# Patient Record
Sex: Male | Born: 1965 | Race: White | Hispanic: No | Marital: Married | Smoking: Current every day smoker
Health system: Southern US, Community
[De-identification: ages and names within clinical notes are randomized; demographics above are authoritative.]

## PROBLEM LIST (undated history)

## (undated) DIAGNOSIS — G8929 Other chronic pain: Secondary | ICD-10-CM

## (undated) DIAGNOSIS — N289 Disorder of kidney and ureter, unspecified: Secondary | ICD-10-CM

## (undated) DIAGNOSIS — Z8679 Personal history of other diseases of the circulatory system: Secondary | ICD-10-CM

## (undated) DIAGNOSIS — E669 Obesity, unspecified: Secondary | ICD-10-CM

## (undated) DIAGNOSIS — Z87442 Personal history of urinary calculi: Secondary | ICD-10-CM

## (undated) HISTORY — DX: Personal history of other diseases of the circulatory system: Z86.79

## (undated) HISTORY — DX: Personal history of urinary calculi: Z87.442

## (undated) HISTORY — DX: Obesity, unspecified: E66.9

## (undated) HISTORY — DX: Other chronic pain: G89.29

---
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
## 1997-11-27 ENCOUNTER — Encounter: Payer: Self-pay | Admitting: Emergency Medicine

## 1997-11-27 ENCOUNTER — Ambulatory Visit (HOSPITAL_COMMUNITY): Admission: RE | Admit: 1997-11-27 | Discharge: 1997-11-27 | Payer: Self-pay | Admitting: Neurosurgery

## 1997-11-27 ENCOUNTER — Emergency Department (HOSPITAL_COMMUNITY): Admission: EM | Admit: 1997-11-27 | Discharge: 1997-11-27 | Payer: Self-pay | Admitting: Emergency Medicine

## 1997-12-11 ENCOUNTER — Ambulatory Visit (HOSPITAL_COMMUNITY): Admission: RE | Admit: 1997-12-11 | Discharge: 1997-12-11 | Payer: Self-pay | Admitting: Neurosurgery

## 1997-12-25 ENCOUNTER — Ambulatory Visit (HOSPITAL_COMMUNITY): Admission: RE | Admit: 1997-12-25 | Discharge: 1997-12-25 | Payer: Self-pay | Admitting: Neurosurgery

## 1998-02-01 ENCOUNTER — Encounter: Payer: Self-pay | Admitting: Internal Medicine

## 1998-02-01 ENCOUNTER — Ambulatory Visit (HOSPITAL_COMMUNITY): Admission: RE | Admit: 1998-02-01 | Discharge: 1998-02-01 | Payer: Self-pay | Admitting: Internal Medicine

## 1998-03-14 ENCOUNTER — Inpatient Hospital Stay (HOSPITAL_COMMUNITY): Admission: RE | Admit: 1998-03-14 | Discharge: 1998-03-17 | Payer: Self-pay | Admitting: Neurosurgery

## 1998-12-16 ENCOUNTER — Encounter: Admission: RE | Admit: 1998-12-16 | Discharge: 1998-12-16 | Payer: Self-pay | Admitting: Neurosurgery

## 1999-03-18 ENCOUNTER — Encounter: Admission: RE | Admit: 1999-03-18 | Discharge: 1999-03-18 | Payer: Self-pay | Admitting: Neurosurgery

## 1999-08-29 ENCOUNTER — Ambulatory Visit (HOSPITAL_COMMUNITY): Admission: RE | Admit: 1999-08-29 | Discharge: 1999-08-29 | Payer: Self-pay | Admitting: Neurosurgery

## 2000-03-23 ENCOUNTER — Encounter: Admission: RE | Admit: 2000-03-23 | Discharge: 2000-03-23 | Payer: Self-pay | Admitting: Neurosurgery

## 2000-06-15 ENCOUNTER — Encounter: Admission: RE | Admit: 2000-06-15 | Discharge: 2000-06-15 | Payer: Self-pay | Admitting: Neurosurgery

## 2000-08-15 ENCOUNTER — Emergency Department (HOSPITAL_COMMUNITY): Admission: EM | Admit: 2000-08-15 | Discharge: 2000-08-15 | Payer: Self-pay | Admitting: Emergency Medicine

## 2000-12-17 ENCOUNTER — Encounter: Admission: RE | Admit: 2000-12-17 | Discharge: 2000-12-17 | Payer: Self-pay | Admitting: Neurosurgery

## 2010-11-05 ENCOUNTER — Other Ambulatory Visit: Payer: Self-pay | Admitting: Neurosurgery

## 2010-11-05 DIAGNOSIS — M549 Dorsalgia, unspecified: Secondary | ICD-10-CM

## 2010-11-17 ENCOUNTER — Ambulatory Visit
Admission: RE | Admit: 2010-11-17 | Discharge: 2010-11-17 | Disposition: A | Discharge status: Home / Self Care | Payer: No Typology Code available for payment source | Source: Ambulatory Visit | Attending: Neurosurgery | Admitting: Neurosurgery

## 2010-11-17 DIAGNOSIS — M549 Dorsalgia, unspecified: Secondary | ICD-10-CM

## 2016-07-29 ENCOUNTER — Other Ambulatory Visit: Payer: Self-pay | Admitting: Neurosurgery

## 2016-07-29 DIAGNOSIS — M545 Low back pain: Principal | ICD-10-CM

## 2016-07-29 DIAGNOSIS — G8929 Other chronic pain: Secondary | ICD-10-CM

## 2016-08-21 ENCOUNTER — Inpatient Hospital Stay
Admission: RE | Admit: 2016-08-21 | Discharge: 2016-08-21 | Disposition: A | Discharge status: Home / Self Care | Payer: Managed Care, Other (non HMO) | Source: Ambulatory Visit | Attending: Neurosurgery | Admitting: Neurosurgery

## 2016-08-21 ENCOUNTER — Other Ambulatory Visit: Payer: Self-pay

## 2016-09-04 ENCOUNTER — Other Ambulatory Visit: Payer: Self-pay

## 2016-09-04 ENCOUNTER — Inpatient Hospital Stay
Admission: RE | Admit: 2016-09-04 | Discharge: 2016-09-04 | Disposition: A | Discharge status: Home / Self Care | Payer: Managed Care, Other (non HMO) | Source: Ambulatory Visit | Attending: Neurosurgery | Admitting: Neurosurgery

## 2016-09-04 NOTE — Discharge Instructions (Signed)

## 2017-04-13 ENCOUNTER — Other Ambulatory Visit: Payer: Self-pay | Admitting: Neurosurgery

## 2017-04-13 DIAGNOSIS — M48061 Spinal stenosis, lumbar region without neurogenic claudication: Secondary | ICD-10-CM

## 2017-05-28 ENCOUNTER — Ambulatory Visit
Admission: RE | Admit: 2017-05-28 | Discharge: 2017-05-28 | Disposition: A | Discharge status: Home / Self Care | Payer: 59 | Source: Ambulatory Visit | Attending: Neurosurgery | Admitting: Neurosurgery

## 2017-05-28 DIAGNOSIS — M48061 Spinal stenosis, lumbar region without neurogenic claudication: Secondary | ICD-10-CM

## 2017-05-28 MED ORDER — DIAZEPAM 5 MG PO TABS
10.0000 mg | ORAL_TABLET | Freq: Once | ORAL | Status: AC
Start: 2017-05-28 — End: 2017-05-28
  Administered 2017-05-28: 10 mg via ORAL

## 2017-05-28 MED ORDER — IOPAMIDOL (ISOVUE-M 200) INJECTION 41%
15.0000 mL | Freq: Once | INTRAMUSCULAR | Status: AC
  Administered 2017-05-28: 15 mL via INTRATHECAL

## 2017-05-28 MED ORDER — MEPERIDINE HCL 100 MG/ML IJ SOLN
75.0000 mg | Freq: Once | INTRAMUSCULAR | Status: AC
  Administered 2017-05-28: 75 mg via INTRAMUSCULAR

## 2017-05-28 MED ORDER — ONDANSETRON HCL 4 MG/2ML IJ SOLN
4.0000 mg | Freq: Once | INTRAMUSCULAR | Status: AC
  Administered 2017-05-28: 4 mg via INTRAMUSCULAR

## 2017-05-28 NOTE — Discharge Instructions (Signed)

## 2018-11-29 ENCOUNTER — Ambulatory Visit
Admission: EM | Admit: 2018-11-29 | Discharge: 2018-11-29 | Disposition: A | Discharge status: Home / Self Care | Payer: Managed Care, Other (non HMO) | Attending: Physician Assistant | Admitting: Physician Assistant

## 2018-11-29 ENCOUNTER — Other Ambulatory Visit: Payer: Self-pay

## 2018-11-29 ENCOUNTER — Encounter: Payer: Self-pay | Admitting: Emergency Medicine

## 2018-11-29 DIAGNOSIS — R109 Unspecified abdominal pain: Secondary | ICD-10-CM | POA: Diagnosis not present

## 2018-11-29 DIAGNOSIS — R300 Dysuria: Secondary | ICD-10-CM

## 2018-11-29 LAB — POCT URINALYSIS DIP (MANUAL ENTRY)
Bilirubin, UA: NEGATIVE
Glucose, UA: NEGATIVE mg/dL
Ketones, POC UA: NEGATIVE mg/dL
Leukocytes, UA: NEGATIVE
Nitrite, UA: NEGATIVE
Protein Ur, POC: NEGATIVE mg/dL
Spec Grav, UA: >=1.03 — AB (ref 1.010–1.025)
Urobilinogen, UA: 0.2 E.U./dL
pH, UA: 6 (ref 5.0–8.0)

## 2018-11-29 MED ORDER — TIZANIDINE HCL 4 MG PO TABS: 4.0000 mg | ORAL_TABLET | Freq: Three times a day (TID) | ORAL | 0 refills | Status: DC | PRN

## 2018-11-29 MED ORDER — TAMSULOSIN HCL 0.4 MG PO CAPS: 0.4000 mg | ORAL_CAPSULE | Freq: Every day | ORAL | 0 refills | Status: DC

## 2018-11-29 MED ORDER — KETOROLAC TROMETHAMINE 30 MG/ML IJ SOLN
30.0000 mg | Freq: Once | INTRAMUSCULAR | Status: AC
  Administered 2018-11-29: 18:00:00 30 mg via INTRAMUSCULAR

## 2018-11-29 NOTE — ED Triage Notes (Signed)
Pt presents to [facility] for assessment of discomfort while urinating x 5 days, right flank pain and RLQ abdominal pain.  Pt states hx of multiple back surgeries, and takes norco which he felt was masking the pain at the time, began to be more severe the last three days.

## 2018-11-29 NOTE — Discharge Instructions (Signed)
As discussed, urine with trace blood.  No infection.  Symptoms possibly due to kidney stone, or muscle pain.  Start Flomax as directed, and stay hydrated.  You can use strainer to see if you pass a stone.  If pain clinic allows, you can take tizanidine for possible muscle pain.  If symptoms does not improve in 1 week,  follow-up with urologist for further evaluation and management needed.  If worsening symptoms, nausea, vomiting, fever, unable to urinate, go to the emergency department for further evaluation.

## 2018-11-29 NOTE — ED Notes (Signed)
Patient able to ambulate independently  

## 2018-11-29 NOTE — ED Provider Notes (Signed)
[facility]    CSN: [id number] Arrival date & time: 11/29/18  1731      History   Chief Complaint Chief Complaint  Patient presents with  . Dysuria  . Flank Pain  . Abdominal Pain    HPI Hayden Thomas is a 53 y.o. male.   53 year old male comes in for 5-day history of dysuria, right flank pain, right lower quadrant pain.  Patient states he takes chronic narcotics due to multiple back surgeries, and this may have masked his pain/changed symptoms.  However, the past 3 days, he has noticed more pain to the lower back/flank area.  He denies radiation of pain, saddle anesthesia, loss of bladder or bowel control.  He denies hematuria, urinary frequency.  Denies fever, chills, body aches.  Denies urinary hesitancy, history of prostate issues.  Denies painful bowel movements.  Denies testicular swelling, testicular pain, penile discharge, penile lesion.  Patient with history of cholecystectomy, appendectomy.  History of lumbar spinal fusion.     Past Medical History:  Diagnosis Date  . Renal disorder    kidney stone    There are no active problems to display for this patient.   Past Surgical History:  Procedure Laterality Date  . BACK SURGERY    . CHOLECYSTECTOMY         Home Medications    Prior to Admission medications   Medication Sig Start Date End Date Taking? Authorizing Provider  aspirin EC 81 MG tablet Take 81 mg by mouth.    [provider]  clotrimazole-betamethasone (LOTRISONE) cream APPLY SPARINGLY TO THE AFFECTED AREA(S) TWICE DAILY. 11/21/15   [provider]  HYDROcodone-acetaminophen (NORCO) 10-325 MG tablet TAKE 1 TABLET BY MOUTH EVERY 8 HOURS AS NEEDED FOR PAIN 02/25/15   [provider]  tamsulosin (FLOMAX) 0.4 MG CAPS capsule Take 1 capsule (0.4 mg total) by mouth daily. 11/29/18   Tasia Catchings, Jarely Juncaj V, PA-C  tiZANidine (ZANAFLEX) 4 MG tablet Take 1 tablet (4 mg total) by mouth every 8 (eight) hours as needed for muscle  spasms. 11/29/18   Ok Edwards, PA-C    Family History Family History  Problem Relation Age of Onset  . Multiple sclerosis Mother     Social History Social History   Tobacco Use  . Smoking status: Current Every Day Smoker    Packs/day: 1.00  . Smokeless tobacco: Never Used  Substance Use Topics  . Alcohol use: Never    Frequency: Never  . Drug use: Never     Allergies   Patient has no known allergies.   Review of Systems Review of Systems  Reason unable to perform ROS: See HPI as above.     Physical Exam Triage Vital Signs ED Triage Vitals [11/29/18 1738]  Enc Vitals Group     BP (!) 164/91     Pulse Rate 85     Resp 18     Temp 97.8 F (36.6 C)     Temp Source Temporal     SpO2 94 %     Weight      Height      Head Circumference      Peak Flow      Pain Score 2     Pain Loc      Pain Edu?      Excl. in [place]?    No data found.  Updated Vital Signs BP (!) 164/91 (BP Location: Right Arm)   Pulse 85   Temp 97.8 F (  36.6 C) (Temporal)   Resp 18   SpO2 94%   Physical Exam Constitutional:      General: He is not in acute distress.    Appearance: He is well-developed. He is not diaphoretic.  HENT:     Head: Normocephalic and atraumatic.  Eyes:     Conjunctiva/sclera: Conjunctivae normal.     Pupils: Pupils are equal, round, and reactive to light.  Cardiovascular:     Rate and Rhythm: Normal rate and regular rhythm.     Heart sounds: Normal heart sounds. No murmur. No friction rub. No gallop.   Pulmonary:     Effort: Pulmonary effort is normal. No accessory muscle usage or respiratory distress.     Breath sounds: Normal breath sounds. No stridor. No decreased breath sounds, wheezing, rhonchi or rales.  Abdominal:     General: Bowel sounds are normal.     Palpations: Abdomen is soft.     Tenderness: There is no abdominal tenderness. There is right CVA tenderness. There is no left CVA tenderness, guarding or rebound. Negative signs include Rovsing's  sign, McBurney's sign, psoas sign and obturator sign.     Hernia: No hernia is present.  Musculoskeletal:     Comments: Vertical scar to the lumbar spine.  Patient would like to avoid palpation to the area due to prior history, and baseline pain.  Current pain is to the right flank/lumbar region.  Tenderness to palpation.  Decreased rotation of back which is at baseline.  Full range of motion of hips.  Strength deferred.  Sensation intact and equal bilaterally.  Negative straight leg raise.  Skin:    General: Skin is warm and dry.  Neurological:     Mental Status: He is alert and oriented to person, place, and time.      UC Treatments / Results  Labs (all labs ordered are listed, but only abnormal results are displayed) Labs Reviewed  POCT URINALYSIS DIP (MANUAL ENTRY) - Abnormal; Notable for the following components:      Result Value   Spec Grav, UA >=1.030 (*)    Blood, UA trace-lysed (*)    All other components within normal limits    EKG   Radiology No results found.  Procedures Procedures (including critical care time)  Medications Ordered in UC Medications  ketorolac (TORADOL) 30 MG/ML injection 30 mg (30 mg Intramuscular Given 11/29/18 1816)    Initial Impression / Assessment and Plan / UC Course  I have reviewed the triage vital signs and the nursing notes.  Pertinent labs & imaging results that were available during my care of the patient were reviewed by me and considered in my medical decision making (see chart for details).    Urine was trace blood.  Given dysuria, CVA tenderness, discussed possible kidney stones causing symptoms.  Lower suspicion for prostatitis at this time given negative leukocytes, nitrites, history of prostate symptoms.  Patient without tenderness to palpation of right lower quadrant, status post appendectomy.  No acute abdomen at this time.  Will provide Toradol injection in office.  Will have patient start Flomax for possible kidney  stone.  Discussed use of tizanidine for possible muscular pain, patient states will check with pain clinic before taking medicine.  Return precautions given.  Patient expresses understanding and agrees to plan.  Final Clinical Impressions(s) / UC Diagnoses   Final diagnoses:  Right flank pain  Dysuria   ED Prescriptions    Medication Sig Dispense Auth. Provider   tamsulosin [facility](FLOMAX)  0.4 MG CAPS capsule Take 1 capsule (0.4 mg total) by mouth daily. 10 capsule Cathie Hoops, Lorna Strother V, PA-C   tiZANidine (ZANAFLEX) 4 MG tablet Take 1 tablet (4 mg total) by mouth every 8 (eight) hours as needed for muscle spasms. 15 tablet Belinda Fisher, PA-C     I have reviewed the PDMP during this encounter.   Belinda Fisher, PA-C 11/29/18 [phone]

## 2019-01-27 ENCOUNTER — Other Ambulatory Visit: Payer: Self-pay

## 2019-01-27 ENCOUNTER — Ambulatory Visit
Admission: EM | Admit: 2019-01-27 | Discharge: 2019-01-27 | Disposition: A | Discharge status: Home / Self Care | Payer: Managed Care, Other (non HMO) | Attending: Physician Assistant | Admitting: Physician Assistant

## 2019-01-27 ENCOUNTER — Encounter: Payer: Self-pay | Admitting: Emergency Medicine

## 2019-01-27 DIAGNOSIS — R300 Dysuria: Secondary | ICD-10-CM | POA: Diagnosis not present

## 2019-01-27 DIAGNOSIS — M545 Low back pain, unspecified: Secondary | ICD-10-CM

## 2019-01-27 LAB — POCT URINALYSIS DIP (MANUAL ENTRY)
Glucose, UA: NEGATIVE mg/dL
Ketones, POC UA: NEGATIVE mg/dL
Leukocytes, UA: NEGATIVE
Nitrite, UA: NEGATIVE
Spec Grav, UA: >=1.03 — AB (ref 1.010–1.025)
Urobilinogen, UA: 0.2 E.U./dL
pH, UA: 5.5 (ref 5.0–8.0)

## 2019-01-27 MED ORDER — KETOROLAC TROMETHAMINE 30 MG/ML IJ SOLN
30.0000 mg | Freq: Once | INTRAMUSCULAR | Status: AC
  Administered 2019-01-27: 19:00:00 30 mg via INTRAMUSCULAR

## 2019-01-27 MED ORDER — SULFAMETHOXAZOLE-TRIMETHOPRIM 800-160 MG PO TABS: 1.0000 | ORAL_TABLET | Freq: Two times a day (BID) | ORAL | 0 refills | Status: AC

## 2019-01-27 MED ORDER — TIZANIDINE HCL 4 MG PO TABS: 4.0000 mg | ORAL_TABLET | Freq: Four times a day (QID) | ORAL | 0 refills | Status: DC | PRN

## 2019-01-27 NOTE — ED Provider Notes (Signed)
[facility]    CSN: [id number] Arrival date & time: 01/27/19  1744      History   Chief Complaint Chief Complaint  Patient presents with  . Abdominal Pain    HPI Hayden Thomas is a 53 y.o. male.   53 year old male comes in for 5-day history of right lower abdominal pain, right low back pain.  Denies injury/trauma.  Has had dysuria for the past 3 days without urinary frequency, urgency, hematuria.  Nausea without vomiting.  States nausea more associated to pain.  Denies fever, chills, body aches.  Denies penile discharge, testicular swelling, testicular pain.  Denies constipation, diarrhea.  Last BM yesterday, no straining.  Has still been passing gas.  History of appendectomy, cholecystectomy.  Patient with history of renal stone, most recently treated 11/29/2018.  Still had mild symptoms, and so urology since visit.  Was told there was "no more kidney stone".  States x-ray was done, no known CT scan/ultrasound.  No obvious history of prostate problems. Sexually active with one male partner.      Past Medical History:  Diagnosis Date  . Renal disorder    kidney stone    There are no problems to display for this patient.   Past Surgical History:  Procedure Laterality Date  . BACK SURGERY    . CHOLECYSTECTOMY         Home Medications    Prior to Admission medications   Medication Sig Start Date End Date Taking? Authorizing Provider  aspirin EC 81 MG tablet Take 81 mg by mouth.    [provider]  clotrimazole-betamethasone (LOTRISONE) cream APPLY SPARINGLY TO THE AFFECTED AREA(S) TWICE DAILY. 11/21/15   [provider]  HYDROcodone-acetaminophen (NORCO) 10-325 MG tablet TAKE 1 TABLET BY MOUTH EVERY 8 HOURS AS NEEDED FOR PAIN 02/25/15   [provider]  sulfamethoxazole-trimethoprim (BACTRIM DS) 800-160 MG tablet Take 1 tablet by mouth 2 (two) times daily for 14 days. 01/27/19 02/10/19  Tasia Catchings, Amy V, PA-C  tiZANidine  (ZANAFLEX) 4 MG tablet Take 1 tablet (4 mg total) by mouth every 6 (six) hours as needed for muscle spasms. 01/27/19   Ok Edwards, PA-C    Family History Family History  Problem Relation Age of Onset  . Multiple sclerosis Mother     Social History Social History   Tobacco Use  . Smoking status: Current Every Day Smoker    Packs/day: 1.00  . Smokeless tobacco: Never Used  Substance Use Topics  . Alcohol use: Never  . Drug use: Never     Allergies   Patient has no known allergies.   Review of Systems Review of Systems  Reason unable to perform ROS: See HPI as above.     Physical Exam Triage Vital Signs ED Triage Vitals  Enc Vitals Group     BP 01/27/19 1753 (!) 148/78     Pulse Rate 01/27/19 1753 84     Resp 01/27/19 1753 20     Temp --      Temp Source 01/27/19 1753 Oral     SpO2 01/27/19 1753 96 %     Weight --      Height --      Head Circumference --      Peak Flow --      Pain Score 01/27/19 1754 2     Pain Loc --      Pain Edu? --      Excl. in [place]? --  No data found.  Updated Vital Signs BP (!) 148/78 (BP Location: Left Arm)   Pulse 84   Resp 20   SpO2 96%   Visual Acuity Right Eye Distance:   Left Eye Distance:   Bilateral Distance:    Right Eye Near:   Left Eye Near:    Bilateral Near:     Physical Exam Constitutional:      General: He is not in acute distress.    Appearance: He is well-developed. He is not ill-appearing, toxic-appearing or diaphoretic.  HENT:     Head: Normocephalic and atraumatic.  Eyes:     Conjunctiva/sclera: Conjunctivae normal.     Pupils: Pupils are equal, round, and reactive to light.  Cardiovascular:     Rate and Rhythm: Normal rate and regular rhythm.     Heart sounds: Normal heart sounds. No murmur. No friction rub. No gallop.   Pulmonary:     Effort: Pulmonary effort is normal. No accessory muscle usage or respiratory distress.     Breath sounds: Normal breath sounds. No stridor. No decreased breath  sounds, wheezing, rhonchi or rales.  Abdominal:     General: Bowel sounds are normal.     Palpations: Abdomen is soft.     Tenderness: There is no abdominal tenderness. There is no right CVA tenderness, left CVA tenderness, guarding or rebound.  Musculoskeletal:     Comments: No tenderness on palpation of the spinous processes. Tenderness to palpation of right lower back. Full range of motion. Negative straight leg raise.   Skin:    General: Skin is warm and dry.  Neurological:     Mental Status: He is alert and oriented to person, place, and time.      UC Treatments / Results  Labs (all labs ordered are listed, but only abnormal results are displayed) Labs Reviewed  POCT URINALYSIS DIP (MANUAL ENTRY) - Abnormal; Notable for the following components:      Result Value   Bilirubin, UA small (*)    Spec Grav, UA >=1.030 (*)    Blood, UA trace-intact (*)    Protein Ur, POC trace (*)    All other components within normal limits  URINE CULTURE    EKG   Radiology No results found.  Procedures Procedures (including critical care time)  Medications Ordered in UC Medications  ketorolac (TORADOL) 30 MG/ML injection 30 mg (30 mg Intramuscular Given 01/27/19 1841)    Initial Impression / Assessment and Plan / UC Course  I have reviewed the triage vital signs and the nursing notes.  Pertinent labs & imaging results that were available during my care of the patient were reviewed by me and considered in my medical decision making (see chart for details).    Discussed urine results with patient.  Still with trace blood, though was told by urology there was no longer kidney stones.  At this time, with dysuria, back pain without CVA tenderness, discussed possible prostatitis causing symptoms.  Will send for urine culture, and start Bactrim as directed.  Will provide symptomatic management with Toradol and tizanidine.  Patient to follow-up with PCP for further evaluation.  Return  precautions given.  Patient expresses understanding and agrees to plan.  Final Clinical Impressions(s) / UC Diagnoses   Final diagnoses:  Dysuria  Acute right-sided low back pain without sciatica    ED Prescriptions    Medication Sig Dispense Auth. Provider   sulfamethoxazole-trimethoprim (BACTRIM DS) 800-160 MG tablet Take 1 tablet by mouth 2 (two)  times daily for 14 days. 28 tablet Yu, Amy V, PA-C   tiZANidine (ZANAFLEX) 4 MG tablet Take 1 tablet (4 mg total) by mouth every 6 (six) hours as needed for muscle spasms. 30 tablet Belinda Fisher, PA-C     I have reviewed the PDMP during this encounter.   Belinda Fisher, PA-C 01/27/19 1927

## 2019-01-27 NOTE — Discharge Instructions (Addendum)
As discussed, urine showed blood, no obvious bacteria.  I am sending it for urine culture.  Given your history and exam, I would like to cover for prostate infection.  Start Bactrim as directed.  You can take tizanidine as needed to help with back pain.  Please follow-up with PCP for further evaluation needed.  If symptoms worsens, has fever, chills, nausea/vomiting, no longer moving bowels/passing gas, go to the emergency department for further evaluation.

## 2019-01-27 NOTE — ED Triage Notes (Addendum)
Pt presents to [facility] for assessment of RLQ abdominal pain and right lower back pain since Monday, stating last night his normal pain medication would not help and he could not sleep.  Also c/o burning with urination x 3 days ago.  Pt states pain is worse with movement and pressure to the area.  Hx of appendectomy and cholecystectomy.

## 2019-01-27 NOTE — ED Notes (Signed)
Patient able to ambulate independently  

## 2019-01-31 LAB — URINE CULTURE: Culture: NO GROWTH

## 2019-03-10 ENCOUNTER — Telehealth: Payer: Self-pay | Admitting: Oncology

## 2019-03-10 NOTE — Telephone Encounter (Signed)
Called patient to remind of appointment and let pt know it is an over the phone visit, left voicemail

## 2019-03-13 ENCOUNTER — Ambulatory Visit: Payer: Managed Care, Other (non HMO)

## 2019-04-25 IMAGING — CT CT L SPINE W/ CM
1 of 7 series · 5 of 14 positions shown, 7 images · non-contrast
Comparison: MRI of the lumbar spine [REDACTED]
04/15/2013

CLINICAL DATA: Low back pain extending into the right lower
extremity. The patient is status post lumbar fusion L3-S1. He was
asymptomatic following surgery for several years. Current symptoms
have progressed over the last year.
TECHNIQUE: Contiguous axial images were obtained through the Lumbar spine after
the intrathecal infusion of infusion. Coronal and sagittal
reconstructions were obtained of the axial image sets.

[Series 3: l spine soft · axial · 0.27mm/px · z∈[-391,-241]mm · 5 of 76 slices shown, 7 images]
[im 13/76  soft-tissue]
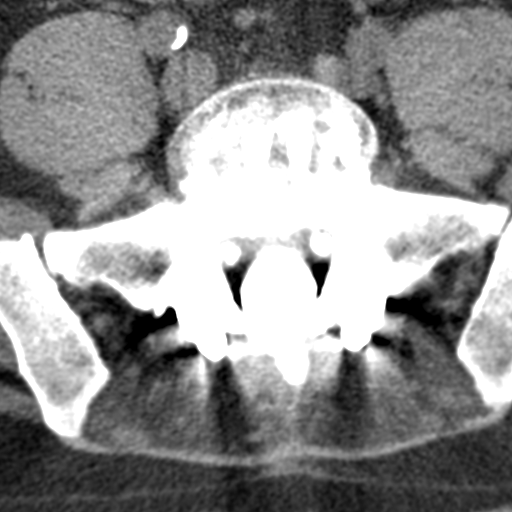
[im 13/76  bone]
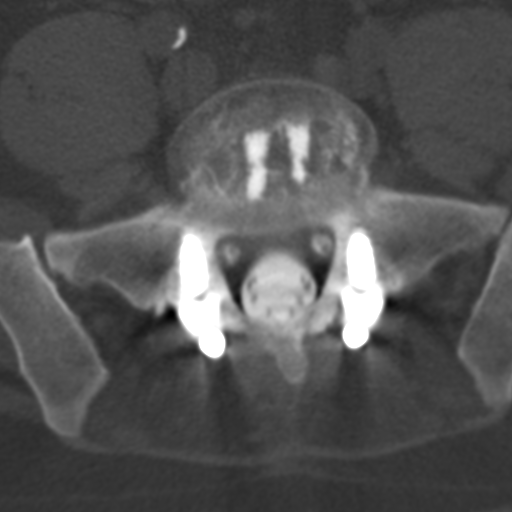
[im 26/76  bone]
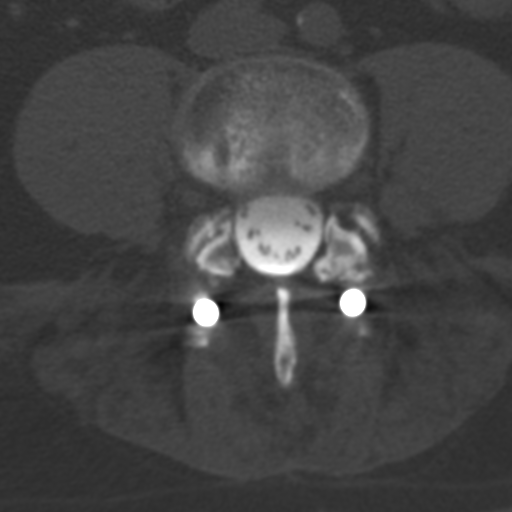
[im 38/76  bone]
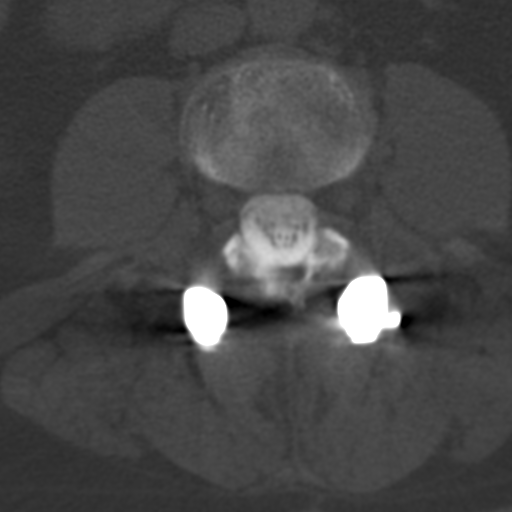
[im 51/76  bone]
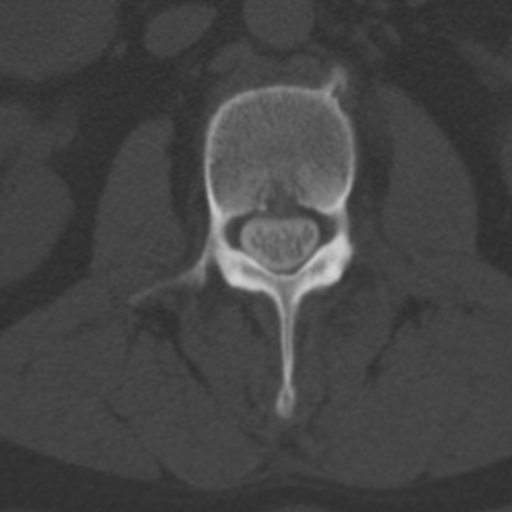
[im 63/76  soft-tissue]
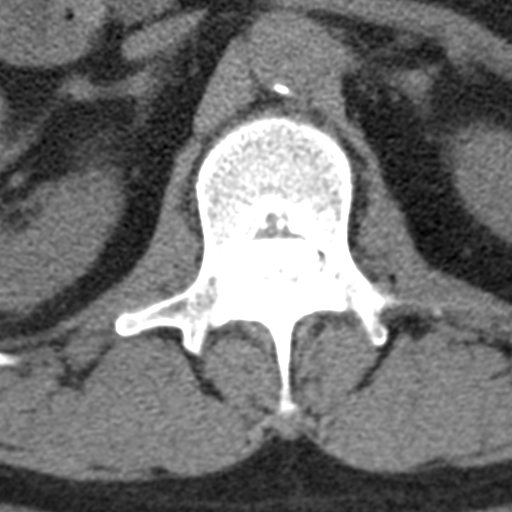
[im 63/76  bone]
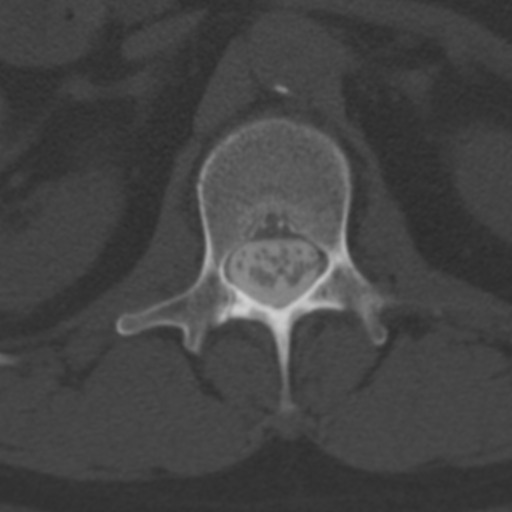

[5 of 14 positions shown; findings below may reference images not displayed]

EXAM:
LUMBAR MYELOGRAM

FLUOROSCOPY TIME:  Radiation Exposure Index (as provided by the
fluoroscopic device): 285.98 uGy*m2

Fluoroscopy Time:  21 seconds

Number of Acquired Images:  16

PROCEDURE:
After thorough discussion of risks and benefits of the procedure
including bleeding, infection, injury to nerves, blood vessels,
adjacent structures as well as headache and CSF leak, written and
oral informed consent was obtained. Consent was obtained by Dr.
Tjino Gunning. Time out form was completed.

Patient was positioned prone on the fluoroscopy table. Local
anesthesia was provided with 1% lidocaine without epinephrine after
prepped and draped in the usual sterile fashion. Puncture was
performed at L5-S1 using a 3 1/2 inch 22-gauge spinal needle via
left paramedian approach. Using a single pass through the dura, the
needle was placed within the thecal sac, with return of clear CSF.
15 mL of Isovue 0-XAA was injected into the thecal sac, with normal
opacification of the nerve roots and cauda equina consistent with
free flow within the subarachnoid space.

I personally performed the lumbar puncture and administered the
intrathecal contrast. I also personally supervised acquisition of
the myelogram images.
FINDINGS: LUMBAR MYELOGRAM FINDINGS:

Lumbar fusion is noted at L3-4, L4-5, and L5-S1. No focal stenosis
is present at these levels.

Adjacent level disease is again noted at L2-3. Grade 1
retrolisthesis is present. Moderate central canal stenosis is
present. The retrolisthesis is slightly reduced with flexion and
exaggerated in extension. The disc protrusion is worse with
extension. Minimal disc bulging is present at L1-2. No other
abnormal motion is present.

CT LUMBAR MYELOGRAM FINDINGS:

5 non rib-bearing lumbar type vertebral bodies are present. There is
solid fusion at L3-4, L4-5, and L5-S1. Hardware is intact.
Retrolisthesis at L4-5 measures 5 mm.

L1-2: Negative.

L2-3: There is uncovering of a broad-based disc protrusion. This
results in moderate subarticular stenosis, right greater than left.
There is gas in the disc space is well. Mild foraminal narrowing is
worse on the left.

L3-4: Solid fusion is present. The central canal and foramina are
patent.

L4-5: Solid fusion is present. The central canal and foramina are
patent.

L5-S1: Solid fusion is present. The central canal and foramina are
patent.
IMPRESSION: 1. Solid fusion at L3-4, L4-5, and L5-S1 without residual or
recurrent stenosis at these levels.
2. Progressive adjacent level disease at L2-3 with moderate central
canal stenosis and mild bilateral foraminal stenosis.
3. Disc protrusion and retrolisthesis are worse with extension and
slightly reduced in flexion at L2-3.
4. Mild disc bulging at L1-2 without significant stenosis.
